# Patient Record
Sex: Female | Born: 1950 | Race: Black or African American | Hispanic: No | Marital: Single
Health system: Southern US, Community
[De-identification: ages and names within clinical notes are randomized; demographics above are authoritative.]

---

## 1975-01-28 HISTORY — PX: BREAST EXCISIONAL BIOPSY: SUR124

## 1998-09-14 ENCOUNTER — Encounter (INDEPENDENT_AMBULATORY_CARE_PROVIDER_SITE_OTHER): Payer: Self-pay | Admitting: Specialist

## 1998-09-14 ENCOUNTER — Other Ambulatory Visit: Admission: RE | Admit: 1998-09-14 | Discharge: 1998-09-14 | Payer: Self-pay | Admitting: *Deleted

## 1998-12-07 ENCOUNTER — Encounter: Admission: RE | Admit: 1998-12-07 | Discharge: 1998-12-07 | Payer: Self-pay | Admitting: Internal Medicine

## 1998-12-07 ENCOUNTER — Encounter: Payer: Self-pay | Admitting: Internal Medicine

## 1998-12-10 ENCOUNTER — Encounter: Admission: RE | Admit: 1998-12-10 | Discharge: 1998-12-10 | Payer: Self-pay | Admitting: Internal Medicine

## 1998-12-10 ENCOUNTER — Encounter: Payer: Self-pay | Admitting: Internal Medicine

## 1999-08-30 ENCOUNTER — Encounter: Payer: Self-pay | Admitting: Internal Medicine

## 1999-08-30 ENCOUNTER — Encounter: Admission: RE | Admit: 1999-08-30 | Discharge: 1999-08-30 | Payer: Self-pay | Admitting: Internal Medicine

## 2000-09-23 ENCOUNTER — Encounter: Payer: Self-pay | Admitting: Internal Medicine

## 2000-09-23 ENCOUNTER — Encounter: Admission: RE | Admit: 2000-09-23 | Discharge: 2000-09-23 | Payer: Self-pay | Admitting: Internal Medicine

## 2001-09-24 ENCOUNTER — Encounter: Payer: Self-pay | Admitting: Internal Medicine

## 2001-09-24 ENCOUNTER — Encounter: Admission: RE | Admit: 2001-09-24 | Discharge: 2001-09-24 | Payer: Self-pay | Admitting: Internal Medicine

## 2001-10-10 ENCOUNTER — Encounter: Payer: Self-pay | Admitting: Emergency Medicine

## 2001-10-10 ENCOUNTER — Emergency Department (HOSPITAL_COMMUNITY): Admission: EM | Admit: 2001-10-10 | Discharge: 2001-10-10 | Payer: Self-pay | Admitting: Emergency Medicine

## 2002-10-05 ENCOUNTER — Encounter: Payer: Self-pay | Admitting: Internal Medicine

## 2002-10-05 ENCOUNTER — Encounter: Admission: RE | Admit: 2002-10-05 | Discharge: 2002-10-05 | Payer: Self-pay | Admitting: Internal Medicine

## 2002-10-21 ENCOUNTER — Encounter: Payer: Self-pay | Admitting: Internal Medicine

## 2002-10-21 ENCOUNTER — Encounter: Admission: RE | Admit: 2002-10-21 | Discharge: 2002-10-21 | Payer: Self-pay | Admitting: Internal Medicine

## 2003-11-13 ENCOUNTER — Encounter: Admission: RE | Admit: 2003-11-13 | Discharge: 2003-11-13 | Payer: Self-pay | Admitting: Internal Medicine

## 2004-12-03 ENCOUNTER — Encounter: Admission: RE | Admit: 2004-12-03 | Discharge: 2004-12-03 | Payer: Self-pay | Admitting: Internal Medicine

## 2005-12-05 ENCOUNTER — Encounter: Admission: RE | Admit: 2005-12-05 | Discharge: 2005-12-05 | Payer: Self-pay | Admitting: Internal Medicine

## 2006-12-07 ENCOUNTER — Encounter: Admission: RE | Admit: 2006-12-07 | Discharge: 2006-12-07 | Payer: Self-pay | Admitting: Internal Medicine

## 2007-10-29 ENCOUNTER — Other Ambulatory Visit: Admission: RE | Admit: 2007-10-29 | Discharge: 2007-10-29 | Payer: Self-pay | Admitting: Obstetrics and Gynecology

## 2007-12-08 ENCOUNTER — Encounter: Admission: RE | Admit: 2007-12-08 | Discharge: 2007-12-08 | Payer: Self-pay | Admitting: Obstetrics and Gynecology

## 2009-01-18 ENCOUNTER — Encounter: Admission: RE | Admit: 2009-01-18 | Discharge: 2009-01-18 | Payer: Self-pay | Admitting: Internal Medicine

## 2010-01-15 ENCOUNTER — Encounter
Admission: RE | Admit: 2010-01-15 | Discharge: 2010-01-15 | Payer: Self-pay | Source: Home / Self Care | Attending: Internal Medicine | Admitting: Internal Medicine

## 2011-12-05 ENCOUNTER — Other Ambulatory Visit (HOSPITAL_COMMUNITY)
Admission: RE | Admit: 2011-12-05 | Discharge: 2011-12-05 | Disposition: A | Discharge status: Home / Self Care | Payer: Self-pay | Source: Ambulatory Visit | Attending: Obstetrics and Gynecology | Admitting: Obstetrics and Gynecology

## 2011-12-05 ENCOUNTER — Other Ambulatory Visit: Payer: Self-pay | Admitting: Obstetrics and Gynecology

## 2011-12-05 DIAGNOSIS — Z01419 Encounter for gynecological examination (general) (routine) without abnormal findings: Secondary | ICD-10-CM | POA: Insufficient documentation

## 2011-12-31 ENCOUNTER — Other Ambulatory Visit: Payer: Self-pay | Admitting: Internal Medicine

## 2011-12-31 DIAGNOSIS — Z1231 Encounter for screening mammogram for malignant neoplasm of breast: Secondary | ICD-10-CM

## 2012-02-13 ENCOUNTER — Ambulatory Visit
Admission: RE | Admit: 2012-02-13 | Discharge: 2012-02-13 | Disposition: A | Discharge status: Home / Self Care | Payer: Managed Care, Other (non HMO) | Source: Ambulatory Visit | Attending: Internal Medicine | Admitting: Internal Medicine

## 2012-02-13 DIAGNOSIS — Z1231 Encounter for screening mammogram for malignant neoplasm of breast: Secondary | ICD-10-CM

## 2013-02-07 ENCOUNTER — Other Ambulatory Visit: Payer: Self-pay

## 2013-02-07 DIAGNOSIS — Z1231 Encounter for screening mammogram for malignant neoplasm of breast: Secondary | ICD-10-CM

## 2013-02-25 ENCOUNTER — Ambulatory Visit
Admission: RE | Admit: 2013-02-25 | Discharge: 2013-02-25 | Disposition: A | Discharge status: Home / Self Care | Payer: Self-pay | Source: Ambulatory Visit

## 2013-02-25 DIAGNOSIS — Z1231 Encounter for screening mammogram for malignant neoplasm of breast: Secondary | ICD-10-CM

## 2013-03-01 ENCOUNTER — Other Ambulatory Visit: Payer: Self-pay | Admitting: Internal Medicine

## 2013-03-01 DIAGNOSIS — R928 Other abnormal and inconclusive findings on diagnostic imaging of breast: Secondary | ICD-10-CM

## 2013-03-10 ENCOUNTER — Ambulatory Visit
Admission: RE | Admit: 2013-03-10 | Discharge: 2013-03-10 | Disposition: A | Discharge status: Home / Self Care | Payer: Self-pay | Source: Ambulatory Visit | Attending: Internal Medicine | Admitting: Internal Medicine

## 2013-03-10 DIAGNOSIS — R928 Other abnormal and inconclusive findings on diagnostic imaging of breast: Secondary | ICD-10-CM

## 2013-08-09 ENCOUNTER — Other Ambulatory Visit: Payer: Self-pay | Admitting: Internal Medicine

## 2013-08-09 DIAGNOSIS — N6310 Unspecified lump in the right breast, unspecified quadrant: Secondary | ICD-10-CM

## 2013-09-08 ENCOUNTER — Other Ambulatory Visit: Payer: Self-pay | Admitting: Internal Medicine

## 2013-09-08 ENCOUNTER — Encounter (INDEPENDENT_AMBULATORY_CARE_PROVIDER_SITE_OTHER): Payer: Self-pay

## 2013-09-08 ENCOUNTER — Ambulatory Visit
Admission: RE | Admit: 2013-09-08 | Discharge: 2013-09-08 | Disposition: A | Discharge status: Home / Self Care | Payer: BC Managed Care – PPO | Source: Ambulatory Visit | Attending: Internal Medicine | Admitting: Internal Medicine

## 2013-09-08 DIAGNOSIS — N6310 Unspecified lump in the right breast, unspecified quadrant: Secondary | ICD-10-CM

## 2013-09-15 ENCOUNTER — Ambulatory Visit
Admission: RE | Admit: 2013-09-15 | Discharge: 2013-09-15 | Disposition: A | Discharge status: Home / Self Care | Payer: BC Managed Care – PPO | Source: Ambulatory Visit | Attending: Internal Medicine | Admitting: Internal Medicine

## 2013-09-15 ENCOUNTER — Other Ambulatory Visit: Payer: Self-pay | Admitting: Internal Medicine

## 2013-09-15 DIAGNOSIS — N6310 Unspecified lump in the right breast, unspecified quadrant: Secondary | ICD-10-CM

## 2013-09-15 HISTORY — PX: BREAST BIOPSY: SHX20

## 2014-03-28 ENCOUNTER — Other Ambulatory Visit: Payer: Self-pay

## 2014-03-28 DIAGNOSIS — Z1231 Encounter for screening mammogram for malignant neoplasm of breast: Secondary | ICD-10-CM

## 2014-03-31 ENCOUNTER — Ambulatory Visit
Admission: RE | Admit: 2014-03-31 | Discharge: 2014-03-31 | Disposition: A | Discharge status: Home / Self Care | Payer: BLUE CROSS/BLUE SHIELD | Source: Ambulatory Visit

## 2014-03-31 DIAGNOSIS — Z1231 Encounter for screening mammogram for malignant neoplasm of breast: Secondary | ICD-10-CM

## 2014-10-23 ENCOUNTER — Other Ambulatory Visit (HOSPITAL_COMMUNITY)
Admission: RE | Admit: 2014-10-23 | Discharge: 2014-10-23 | Disposition: A | Discharge status: Home / Self Care | Payer: BLUE CROSS/BLUE SHIELD | Source: Ambulatory Visit | Attending: Obstetrics and Gynecology | Admitting: Obstetrics and Gynecology

## 2014-10-23 ENCOUNTER — Other Ambulatory Visit: Payer: Self-pay | Admitting: Obstetrics and Gynecology

## 2014-10-23 DIAGNOSIS — Z1151 Encounter for screening for human papillomavirus (HPV): Secondary | ICD-10-CM | POA: Insufficient documentation

## 2014-10-23 DIAGNOSIS — Z01419 Encounter for gynecological examination (general) (routine) without abnormal findings: Secondary | ICD-10-CM | POA: Diagnosis present

## 2014-10-24 LAB — CYTOLOGY - PAP

## 2015-03-09 ENCOUNTER — Other Ambulatory Visit: Payer: Self-pay

## 2015-03-09 DIAGNOSIS — Z1231 Encounter for screening mammogram for malignant neoplasm of breast: Secondary | ICD-10-CM

## 2015-04-02 ENCOUNTER — Ambulatory Visit
Admission: RE | Admit: 2015-04-02 | Discharge: 2015-04-02 | Disposition: A | Discharge status: Home / Self Care | Payer: BLUE CROSS/BLUE SHIELD | Source: Ambulatory Visit

## 2015-04-02 DIAGNOSIS — Z1231 Encounter for screening mammogram for malignant neoplasm of breast: Secondary | ICD-10-CM

## 2015-06-05 ENCOUNTER — Other Ambulatory Visit: Payer: Self-pay | Admitting: Gastroenterology

## 2015-10-31 ENCOUNTER — Other Ambulatory Visit: Payer: Self-pay | Admitting: Obstetrics and Gynecology

## 2016-05-01 ENCOUNTER — Other Ambulatory Visit: Payer: Self-pay | Admitting: Obstetrics and Gynecology

## 2016-05-01 DIAGNOSIS — Z1231 Encounter for screening mammogram for malignant neoplasm of breast: Secondary | ICD-10-CM

## 2016-05-21 ENCOUNTER — Encounter: Payer: Self-pay | Admitting: Radiology

## 2016-05-21 ENCOUNTER — Ambulatory Visit
Admission: RE | Admit: 2016-05-21 | Discharge: 2016-05-21 | Disposition: A | Discharge status: Home / Self Care | Payer: BLUE CROSS/BLUE SHIELD | Source: Ambulatory Visit | Attending: Obstetrics and Gynecology | Admitting: Obstetrics and Gynecology

## 2016-05-21 DIAGNOSIS — Z1231 Encounter for screening mammogram for malignant neoplasm of breast: Secondary | ICD-10-CM

## 2016-11-03 DIAGNOSIS — B379 Candidiasis, unspecified: Secondary | ICD-10-CM | POA: Diagnosis not present

## 2016-11-03 DIAGNOSIS — N814 Uterovaginal prolapse, unspecified: Secondary | ICD-10-CM | POA: Diagnosis not present

## 2016-11-03 DIAGNOSIS — Z01411 Encounter for gynecological examination (general) (routine) with abnormal findings: Secondary | ICD-10-CM | POA: Diagnosis not present

## 2016-11-03 DIAGNOSIS — N8110 Cystocele, unspecified: Secondary | ICD-10-CM | POA: Diagnosis not present

## 2016-12-08 DIAGNOSIS — Z6841 Body Mass Index (BMI) 40.0 and over, adult: Secondary | ICD-10-CM | POA: Diagnosis not present

## 2016-12-08 DIAGNOSIS — Z131 Encounter for screening for diabetes mellitus: Secondary | ICD-10-CM | POA: Diagnosis not present

## 2016-12-08 DIAGNOSIS — Z713 Dietary counseling and surveillance: Secondary | ICD-10-CM | POA: Diagnosis not present

## 2016-12-08 DIAGNOSIS — Z136 Encounter for screening for cardiovascular disorders: Secondary | ICD-10-CM | POA: Diagnosis not present

## 2016-12-08 DIAGNOSIS — Z13220 Encounter for screening for lipoid disorders: Secondary | ICD-10-CM | POA: Diagnosis not present

## 2017-03-20 DIAGNOSIS — E663 Overweight: Secondary | ICD-10-CM | POA: Diagnosis not present

## 2017-03-20 DIAGNOSIS — E7800 Pure hypercholesterolemia, unspecified: Secondary | ICD-10-CM | POA: Diagnosis not present

## 2017-03-20 DIAGNOSIS — I10 Essential (primary) hypertension: Secondary | ICD-10-CM | POA: Diagnosis not present

## 2017-04-27 ENCOUNTER — Other Ambulatory Visit: Payer: Self-pay | Admitting: Obstetrics and Gynecology

## 2017-04-27 DIAGNOSIS — Z1231 Encounter for screening mammogram for malignant neoplasm of breast: Secondary | ICD-10-CM

## 2017-05-22 ENCOUNTER — Ambulatory Visit
Admission: RE | Admit: 2017-05-22 | Discharge: 2017-05-22 | Disposition: A | Discharge status: Home / Self Care | Payer: BLUE CROSS/BLUE SHIELD | Source: Ambulatory Visit | Attending: Obstetrics and Gynecology | Admitting: Obstetrics and Gynecology

## 2017-05-22 DIAGNOSIS — Z1231 Encounter for screening mammogram for malignant neoplasm of breast: Secondary | ICD-10-CM | POA: Diagnosis not present

## 2017-07-31 DIAGNOSIS — M79672 Pain in left foot: Secondary | ICD-10-CM | POA: Diagnosis not present

## 2017-07-31 DIAGNOSIS — M7542 Impingement syndrome of left shoulder: Secondary | ICD-10-CM | POA: Diagnosis not present

## 2017-10-22 DIAGNOSIS — Z0000 Encounter for general adult medical examination without abnormal findings: Secondary | ICD-10-CM | POA: Diagnosis not present

## 2017-10-22 DIAGNOSIS — E2839 Other primary ovarian failure: Secondary | ICD-10-CM | POA: Diagnosis not present

## 2017-10-22 DIAGNOSIS — I10 Essential (primary) hypertension: Secondary | ICD-10-CM | POA: Diagnosis not present

## 2017-11-03 ENCOUNTER — Other Ambulatory Visit: Payer: Self-pay | Admitting: Internal Medicine

## 2017-11-03 DIAGNOSIS — E2839 Other primary ovarian failure: Secondary | ICD-10-CM

## 2017-11-09 ENCOUNTER — Other Ambulatory Visit (HOSPITAL_COMMUNITY)
Admission: RE | Admit: 2017-11-09 | Discharge: 2017-11-09 | Disposition: A | Discharge status: Home / Self Care | Payer: BLUE CROSS/BLUE SHIELD | Source: Ambulatory Visit | Attending: Obstetrics and Gynecology | Admitting: Obstetrics and Gynecology

## 2017-11-09 ENCOUNTER — Other Ambulatory Visit: Payer: Self-pay | Admitting: Obstetrics and Gynecology

## 2017-11-09 DIAGNOSIS — Z01411 Encounter for gynecological examination (general) (routine) with abnormal findings: Secondary | ICD-10-CM | POA: Insufficient documentation

## 2017-11-11 LAB — CYTOLOGY - PAP
Diagnosis: NEGATIVE
HPV: NOT DETECTED

## 2017-11-22 DIAGNOSIS — M109 Gout, unspecified: Secondary | ICD-10-CM | POA: Diagnosis not present

## 2017-12-10 DIAGNOSIS — M79671 Pain in right foot: Secondary | ICD-10-CM | POA: Diagnosis not present

## 2017-12-23 ENCOUNTER — Other Ambulatory Visit: Payer: Self-pay | Admitting: Internal Medicine

## 2017-12-23 DIAGNOSIS — M8580 Other specified disorders of bone density and structure, unspecified site: Secondary | ICD-10-CM

## 2017-12-23 DIAGNOSIS — E2839 Other primary ovarian failure: Secondary | ICD-10-CM

## 2018-01-04 ENCOUNTER — Ambulatory Visit
Admission: RE | Admit: 2018-01-04 | Discharge: 2018-01-04 | Disposition: A | Discharge status: Home / Self Care | Payer: BLUE CROSS/BLUE SHIELD | Source: Ambulatory Visit | Attending: Internal Medicine | Admitting: Internal Medicine

## 2018-01-04 DIAGNOSIS — M8580 Other specified disorders of bone density and structure, unspecified site: Secondary | ICD-10-CM

## 2018-01-04 DIAGNOSIS — Z780 Asymptomatic menopausal state: Secondary | ICD-10-CM | POA: Diagnosis not present

## 2018-01-04 DIAGNOSIS — E2839 Other primary ovarian failure: Secondary | ICD-10-CM

## 2018-01-04 DIAGNOSIS — M85852 Other specified disorders of bone density and structure, left thigh: Secondary | ICD-10-CM | POA: Diagnosis not present

## 2018-07-19 ENCOUNTER — Other Ambulatory Visit: Payer: Self-pay | Admitting: Internal Medicine

## 2018-07-19 DIAGNOSIS — Z1231 Encounter for screening mammogram for malignant neoplasm of breast: Secondary | ICD-10-CM

## 2018-09-02 ENCOUNTER — Ambulatory Visit
Admission: RE | Admit: 2018-09-02 | Discharge: 2018-09-02 | Disposition: A | Discharge status: Home / Self Care | Payer: BLUE CROSS/BLUE SHIELD | Source: Ambulatory Visit | Attending: Internal Medicine | Admitting: Internal Medicine

## 2018-09-02 ENCOUNTER — Other Ambulatory Visit: Payer: Self-pay

## 2018-09-02 DIAGNOSIS — Z1231 Encounter for screening mammogram for malignant neoplasm of breast: Secondary | ICD-10-CM

## 2018-11-26 DIAGNOSIS — E7800 Pure hypercholesterolemia, unspecified: Secondary | ICD-10-CM | POA: Diagnosis not present

## 2018-11-26 DIAGNOSIS — Z0000 Encounter for general adult medical examination without abnormal findings: Secondary | ICD-10-CM | POA: Diagnosis not present

## 2018-11-26 DIAGNOSIS — Z23 Encounter for immunization: Secondary | ICD-10-CM | POA: Diagnosis not present

## 2018-11-26 DIAGNOSIS — I10 Essential (primary) hypertension: Secondary | ICD-10-CM | POA: Diagnosis not present

## 2019-06-01 ENCOUNTER — Other Ambulatory Visit: Payer: Self-pay | Admitting: Internal Medicine

## 2019-06-01 DIAGNOSIS — Z1231 Encounter for screening mammogram for malignant neoplasm of breast: Secondary | ICD-10-CM

## 2019-09-06 ENCOUNTER — Other Ambulatory Visit: Payer: Self-pay

## 2019-09-06 ENCOUNTER — Ambulatory Visit
Admission: RE | Admit: 2019-09-06 | Discharge: 2019-09-06 | Disposition: A | Discharge status: Home / Self Care | Payer: BC Managed Care – PPO | Source: Ambulatory Visit | Attending: Internal Medicine | Admitting: Internal Medicine

## 2019-09-06 DIAGNOSIS — Z1231 Encounter for screening mammogram for malignant neoplasm of breast: Secondary | ICD-10-CM

## 2019-11-12 ENCOUNTER — Ambulatory Visit (HOSPITAL_COMMUNITY)
Admission: RE | Admit: 2019-11-12 | Discharge: 2019-11-12 | Disposition: A | Discharge status: Home / Self Care | Payer: BC Managed Care – PPO | Source: Ambulatory Visit | Attending: Physician Assistant | Admitting: Physician Assistant

## 2019-11-12 ENCOUNTER — Ambulatory Visit (HOSPITAL_COMMUNITY): Payer: BC Managed Care – PPO

## 2019-11-12 ENCOUNTER — Other Ambulatory Visit: Payer: Self-pay | Admitting: Physician Assistant

## 2019-11-12 ENCOUNTER — Other Ambulatory Visit: Payer: Self-pay

## 2019-11-12 DIAGNOSIS — M79605 Pain in left leg: Secondary | ICD-10-CM

## 2019-11-12 NOTE — Progress Notes (Signed)
Left lower extremity venous duplex has been completed. Preliminary results can be found in CV Proc through chart review.  Results were given to [employer] PA.  11/12/19 2:55 PM Olen Cordial RVT

## 2019-11-12 NOTE — Progress Notes (Signed)
dvt

## 2020-02-21 ENCOUNTER — Other Ambulatory Visit: Payer: Self-pay | Admitting: Internal Medicine

## 2020-02-21 DIAGNOSIS — R198 Other specified symptoms and signs involving the digestive system and abdomen: Secondary | ICD-10-CM

## 2020-02-22 ENCOUNTER — Other Ambulatory Visit: Payer: Self-pay | Admitting: Internal Medicine

## 2020-02-22 DIAGNOSIS — M8580 Other specified disorders of bone density and structure, unspecified site: Secondary | ICD-10-CM

## 2020-03-06 ENCOUNTER — Ambulatory Visit
Admission: RE | Admit: 2020-03-06 | Discharge: 2020-03-06 | Disposition: A | Discharge status: Home / Self Care | Payer: BC Managed Care – PPO | Source: Ambulatory Visit | Attending: Internal Medicine | Admitting: Internal Medicine

## 2020-03-06 DIAGNOSIS — R198 Other specified symptoms and signs involving the digestive system and abdomen: Secondary | ICD-10-CM

## 2020-03-06 MED ORDER — IOPAMIDOL (ISOVUE-300) INJECTION 61%
100.0000 mL | Freq: Once | INTRAVENOUS | Status: AC | PRN
  Administered 2020-03-06: 100 mL via INTRAVENOUS

## 2020-07-25 ENCOUNTER — Ambulatory Visit
Admission: RE | Admit: 2020-07-25 | Discharge: 2020-07-25 | Disposition: A | Discharge status: Home / Self Care | Payer: BC Managed Care – PPO | Source: Ambulatory Visit | Attending: Internal Medicine | Admitting: Internal Medicine

## 2020-07-25 ENCOUNTER — Other Ambulatory Visit: Payer: Self-pay

## 2020-07-25 DIAGNOSIS — M8580 Other specified disorders of bone density and structure, unspecified site: Secondary | ICD-10-CM

## 2020-10-29 ENCOUNTER — Other Ambulatory Visit: Payer: Self-pay | Admitting: Internal Medicine

## 2020-10-29 DIAGNOSIS — Z1231 Encounter for screening mammogram for malignant neoplasm of breast: Secondary | ICD-10-CM

## 2020-11-09 ENCOUNTER — Other Ambulatory Visit: Payer: Self-pay

## 2020-11-09 ENCOUNTER — Ambulatory Visit
Admission: RE | Admit: 2020-11-09 | Discharge: 2020-11-09 | Disposition: A | Discharge status: Home / Self Care | Payer: BC Managed Care – PPO | Source: Ambulatory Visit | Attending: Internal Medicine | Admitting: Internal Medicine

## 2020-11-09 DIAGNOSIS — Z1231 Encounter for screening mammogram for malignant neoplasm of breast: Secondary | ICD-10-CM

## 2021-10-10 ENCOUNTER — Other Ambulatory Visit: Payer: Self-pay | Admitting: Internal Medicine

## 2021-10-10 DIAGNOSIS — Z1231 Encounter for screening mammogram for malignant neoplasm of breast: Secondary | ICD-10-CM

## 2021-11-05 ENCOUNTER — Ambulatory Visit
Admission: RE | Admit: 2021-11-05 | Discharge: 2021-11-05 | Disposition: A | Discharge status: Home / Self Care | Payer: BC Managed Care – PPO | Source: Ambulatory Visit | Attending: Internal Medicine | Admitting: Internal Medicine

## 2021-11-05 DIAGNOSIS — Z1231 Encounter for screening mammogram for malignant neoplasm of breast: Secondary | ICD-10-CM

## 2022-02-09 IMAGING — MG DIGITAL SCREENING BILAT W/ TOMO W/ CAD
6 of 12 series · 6 of 36 positions shown · non-contrast
Comparison: Previous exam(s).

CLINICAL DATA: Screening.

EXAM:
DIGITAL SCREENING BILATERAL MAMMOGRAM WITH TOMO AND CAD

[L MLO synth-2D (1 of 2)]
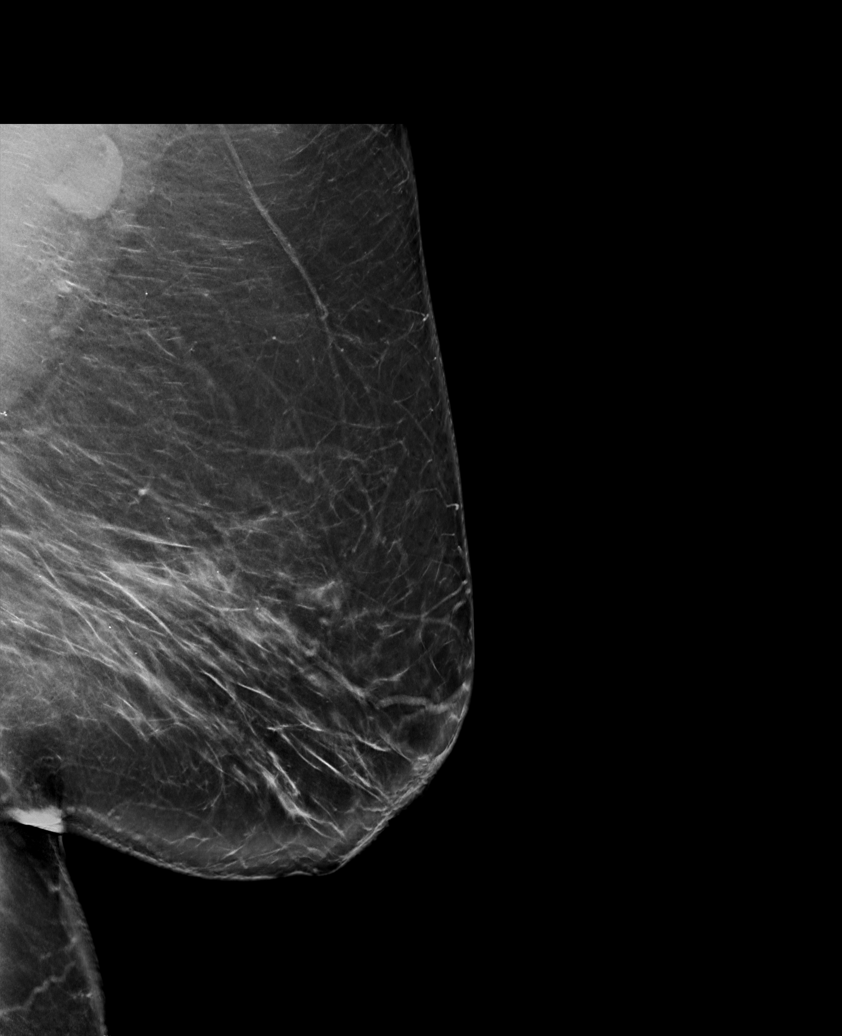

[R MLO synth-2D (1 of 2)]
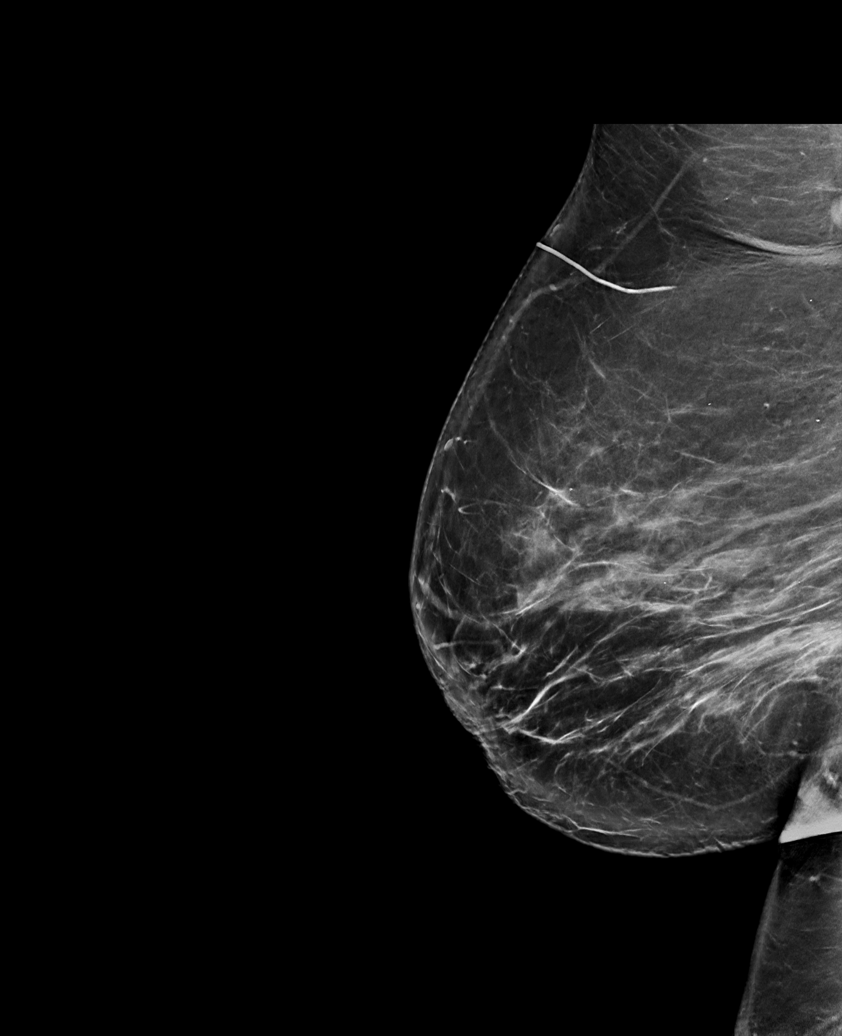

[R CC synth-2D]
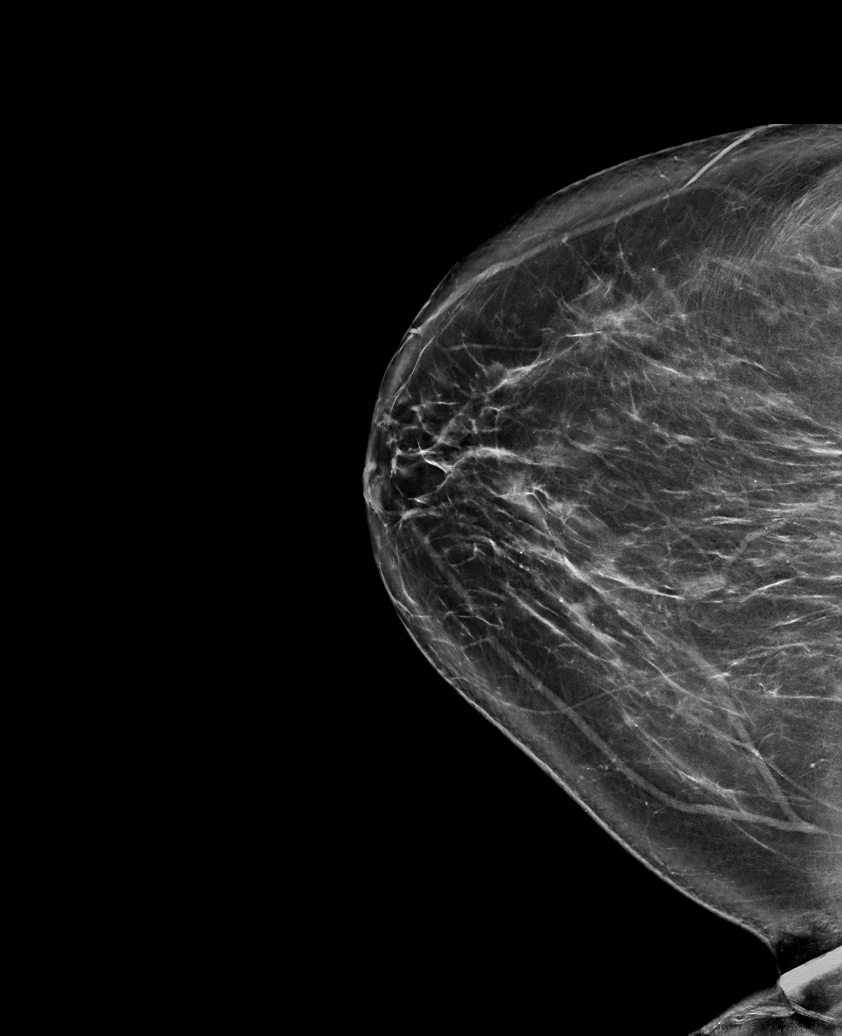

[R MLO synth-2D (2 of 2)]
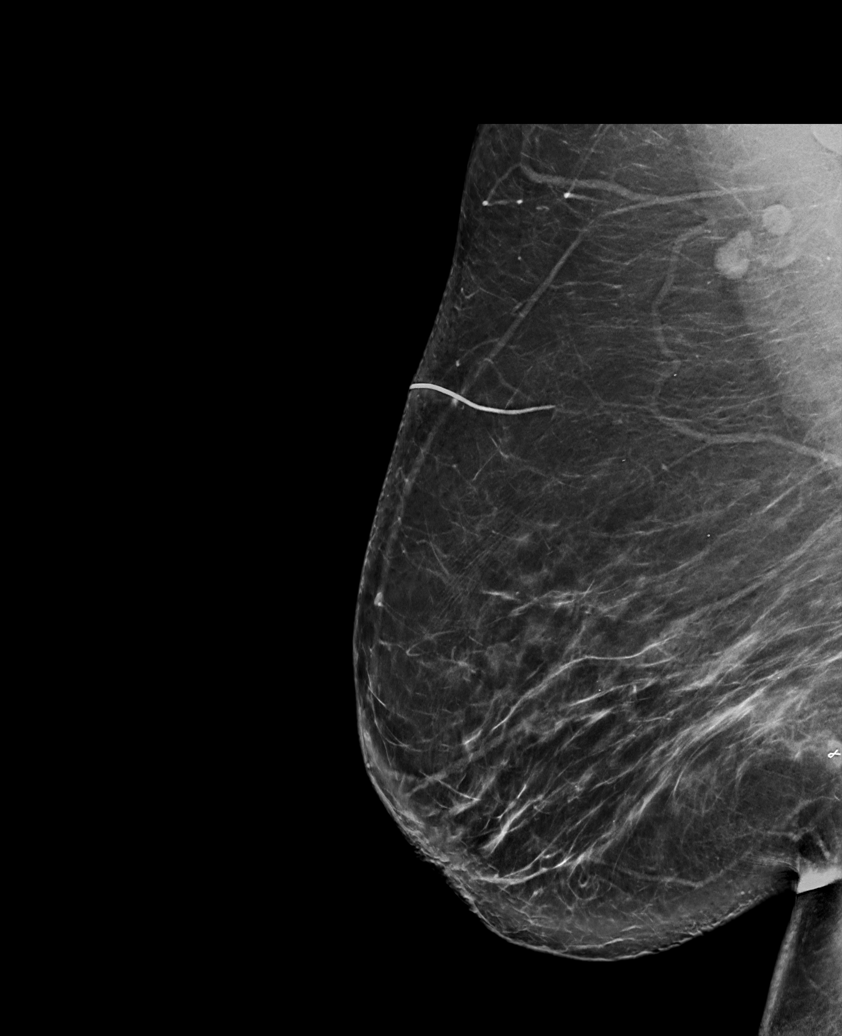

[L CC synth-2D]
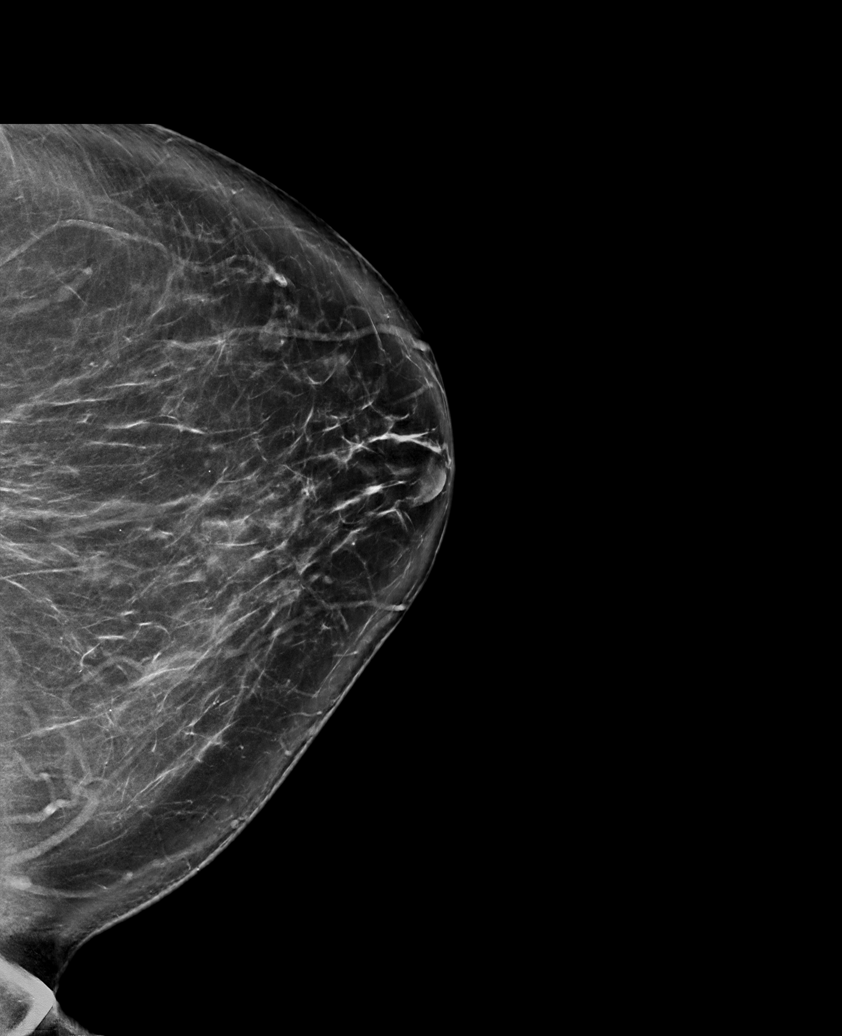

[L MLO synth-2D (2 of 2)]
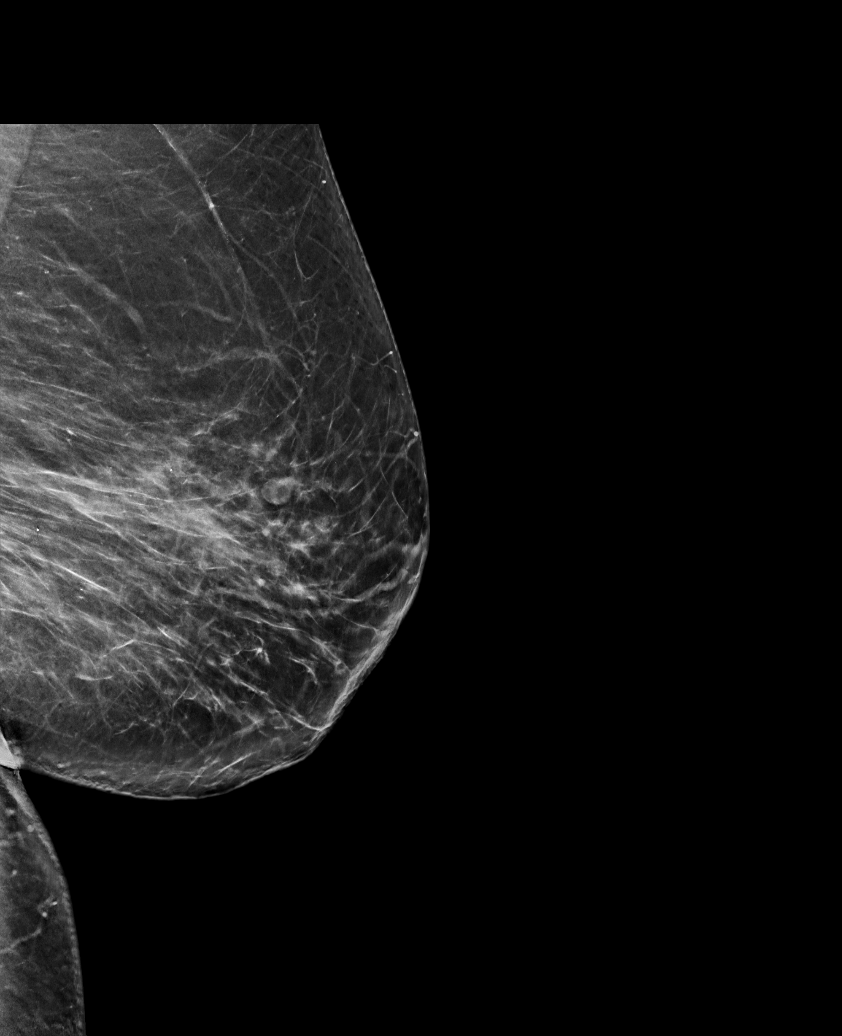

[6 of 36 positions shown; findings below may reference images not displayed]

ACR Breast Density Category b: There are scattered areas of
fibroglandular density.
FINDINGS: There are no findings suspicious for malignancy. Images were
processed with CAD.
IMPRESSION: No mammographic evidence of malignancy. A result letter of this
screening mammogram will be mailed directly to the patient.

RECOMMENDATION:
Screening mammogram in one year. (Code:[id number])

BI-RADS CATEGORY  1: Negative.

## 2022-07-07 DIAGNOSIS — M25571 Pain in right ankle and joints of right foot: Secondary | ICD-10-CM | POA: Diagnosis not present

## 2022-08-13 DIAGNOSIS — M10071 Idiopathic gout, right ankle and foot: Secondary | ICD-10-CM | POA: Diagnosis not present

## 2022-09-12 DIAGNOSIS — Z0000 Encounter for general adult medical examination without abnormal findings: Secondary | ICD-10-CM | POA: Diagnosis not present

## 2022-09-12 DIAGNOSIS — Z5181 Encounter for therapeutic drug level monitoring: Secondary | ICD-10-CM | POA: Diagnosis not present

## 2022-09-12 DIAGNOSIS — E7800 Pure hypercholesterolemia, unspecified: Secondary | ICD-10-CM | POA: Diagnosis not present

## 2022-09-12 DIAGNOSIS — M109 Gout, unspecified: Secondary | ICD-10-CM | POA: Diagnosis not present

## 2022-09-12 DIAGNOSIS — Z79899 Other long term (current) drug therapy: Secondary | ICD-10-CM | POA: Diagnosis not present

## 2022-09-12 DIAGNOSIS — Z6841 Body Mass Index (BMI) 40.0 and over, adult: Secondary | ICD-10-CM | POA: Diagnosis not present

## 2022-09-12 DIAGNOSIS — I10 Essential (primary) hypertension: Secondary | ICD-10-CM | POA: Diagnosis not present

## 2022-09-12 DIAGNOSIS — M8588 Other specified disorders of bone density and structure, other site: Secondary | ICD-10-CM | POA: Diagnosis not present

## 2022-10-14 ENCOUNTER — Other Ambulatory Visit: Payer: Self-pay | Admitting: Internal Medicine

## 2022-10-14 DIAGNOSIS — Z1231 Encounter for screening mammogram for malignant neoplasm of breast: Secondary | ICD-10-CM

## 2022-10-29 ENCOUNTER — Other Ambulatory Visit: Payer: Self-pay | Admitting: Internal Medicine

## 2022-10-29 DIAGNOSIS — M8588 Other specified disorders of bone density and structure, other site: Secondary | ICD-10-CM

## 2022-11-07 ENCOUNTER — Ambulatory Visit
Admission: RE | Admit: 2022-11-07 | Discharge: 2022-11-07 | Disposition: A | Discharge status: Home / Self Care | Payer: Medicare PPO | Source: Ambulatory Visit | Attending: Internal Medicine | Admitting: Internal Medicine

## 2022-11-07 DIAGNOSIS — Z1231 Encounter for screening mammogram for malignant neoplasm of breast: Secondary | ICD-10-CM

## 2023-07-03 ENCOUNTER — Inpatient Hospital Stay: Admission: RE | Admit: 2023-07-03 | Payer: Medicare PPO | Source: Ambulatory Visit

## 2023-07-15 ENCOUNTER — Ambulatory Visit (HOSPITAL_BASED_OUTPATIENT_CLINIC_OR_DEPARTMENT_OTHER)
Admission: RE | Admit: 2023-07-15 | Discharge: 2023-07-15 | Disposition: A | Discharge status: Home / Self Care | Source: Ambulatory Visit | Attending: Internal Medicine | Admitting: Internal Medicine

## 2023-07-15 DIAGNOSIS — Z780 Asymptomatic menopausal state: Secondary | ICD-10-CM | POA: Diagnosis not present

## 2023-07-15 DIAGNOSIS — M85852 Other specified disorders of bone density and structure, left thigh: Secondary | ICD-10-CM | POA: Diagnosis not present

## 2023-07-15 DIAGNOSIS — M85851 Other specified disorders of bone density and structure, right thigh: Secondary | ICD-10-CM | POA: Diagnosis not present

## 2023-07-15 DIAGNOSIS — M8588 Other specified disorders of bone density and structure, other site: Secondary | ICD-10-CM | POA: Diagnosis not present

## 2023-07-23 DIAGNOSIS — M1712 Unilateral primary osteoarthritis, left knee: Secondary | ICD-10-CM | POA: Diagnosis not present

## 2023-11-03 ENCOUNTER — Other Ambulatory Visit: Payer: Self-pay | Admitting: Internal Medicine

## 2023-11-03 DIAGNOSIS — Z1231 Encounter for screening mammogram for malignant neoplasm of breast: Secondary | ICD-10-CM

## 2023-11-17 ENCOUNTER — Ambulatory Visit
Admission: RE | Admit: 2023-11-17 | Discharge: 2023-11-17 | Disposition: A | Discharge status: Home / Self Care | Source: Ambulatory Visit | Attending: Internal Medicine | Admitting: Internal Medicine

## 2023-11-17 DIAGNOSIS — Z1231 Encounter for screening mammogram for malignant neoplasm of breast: Secondary | ICD-10-CM | POA: Diagnosis not present

## 2023-11-30 DIAGNOSIS — M109 Gout, unspecified: Secondary | ICD-10-CM | POA: Diagnosis not present

## 2023-11-30 DIAGNOSIS — Z0000 Encounter for general adult medical examination without abnormal findings: Secondary | ICD-10-CM | POA: Diagnosis not present

## 2023-11-30 DIAGNOSIS — Z5181 Encounter for therapeutic drug level monitoring: Secondary | ICD-10-CM | POA: Diagnosis not present

## 2023-11-30 DIAGNOSIS — M8580 Other specified disorders of bone density and structure, unspecified site: Secondary | ICD-10-CM | POA: Diagnosis not present

## 2023-11-30 DIAGNOSIS — I10 Essential (primary) hypertension: Secondary | ICD-10-CM | POA: Diagnosis not present

## 2023-11-30 DIAGNOSIS — Z1331 Encounter for screening for depression: Secondary | ICD-10-CM | POA: Diagnosis not present

## 2023-11-30 DIAGNOSIS — E7800 Pure hypercholesterolemia, unspecified: Secondary | ICD-10-CM | POA: Diagnosis not present
